# Patient Record
Sex: Female | Born: 1935 | Race: Black or African American | Hispanic: No | Marital: Married
Health system: Southern US, Community
[De-identification: ages and names within clinical notes are randomized; demographics above are authoritative.]

---

## 2000-03-30 ENCOUNTER — Encounter: Payer: Self-pay | Admitting: Ophthalmology

## 2000-04-02 ENCOUNTER — Ambulatory Visit (HOSPITAL_COMMUNITY): Admission: RE | Admit: 2000-04-02 | Discharge: 2000-04-02 | Payer: Self-pay | Admitting: Ophthalmology

## 2000-07-09 ENCOUNTER — Ambulatory Visit (HOSPITAL_COMMUNITY): Admission: RE | Admit: 2000-07-09 | Discharge: 2000-07-09 | Payer: Self-pay | Admitting: Ophthalmology

## 2006-09-25 ENCOUNTER — Ambulatory Visit: Payer: Self-pay | Admitting: Internal Medicine

## 2007-01-19 ENCOUNTER — Ambulatory Visit: Payer: Self-pay | Admitting: Internal Medicine

## 2007-11-26 IMAGING — CR DG CHEST 2V
1 series · 2 of 2 positions shown · non-contrast
Comparison: none

REASON FOR EXAM: Shortness of breath
COMMENTS:

[Series 1: view not recorded · 0.17mm/px · 2 of 2 slices shown]
[im 1/2]
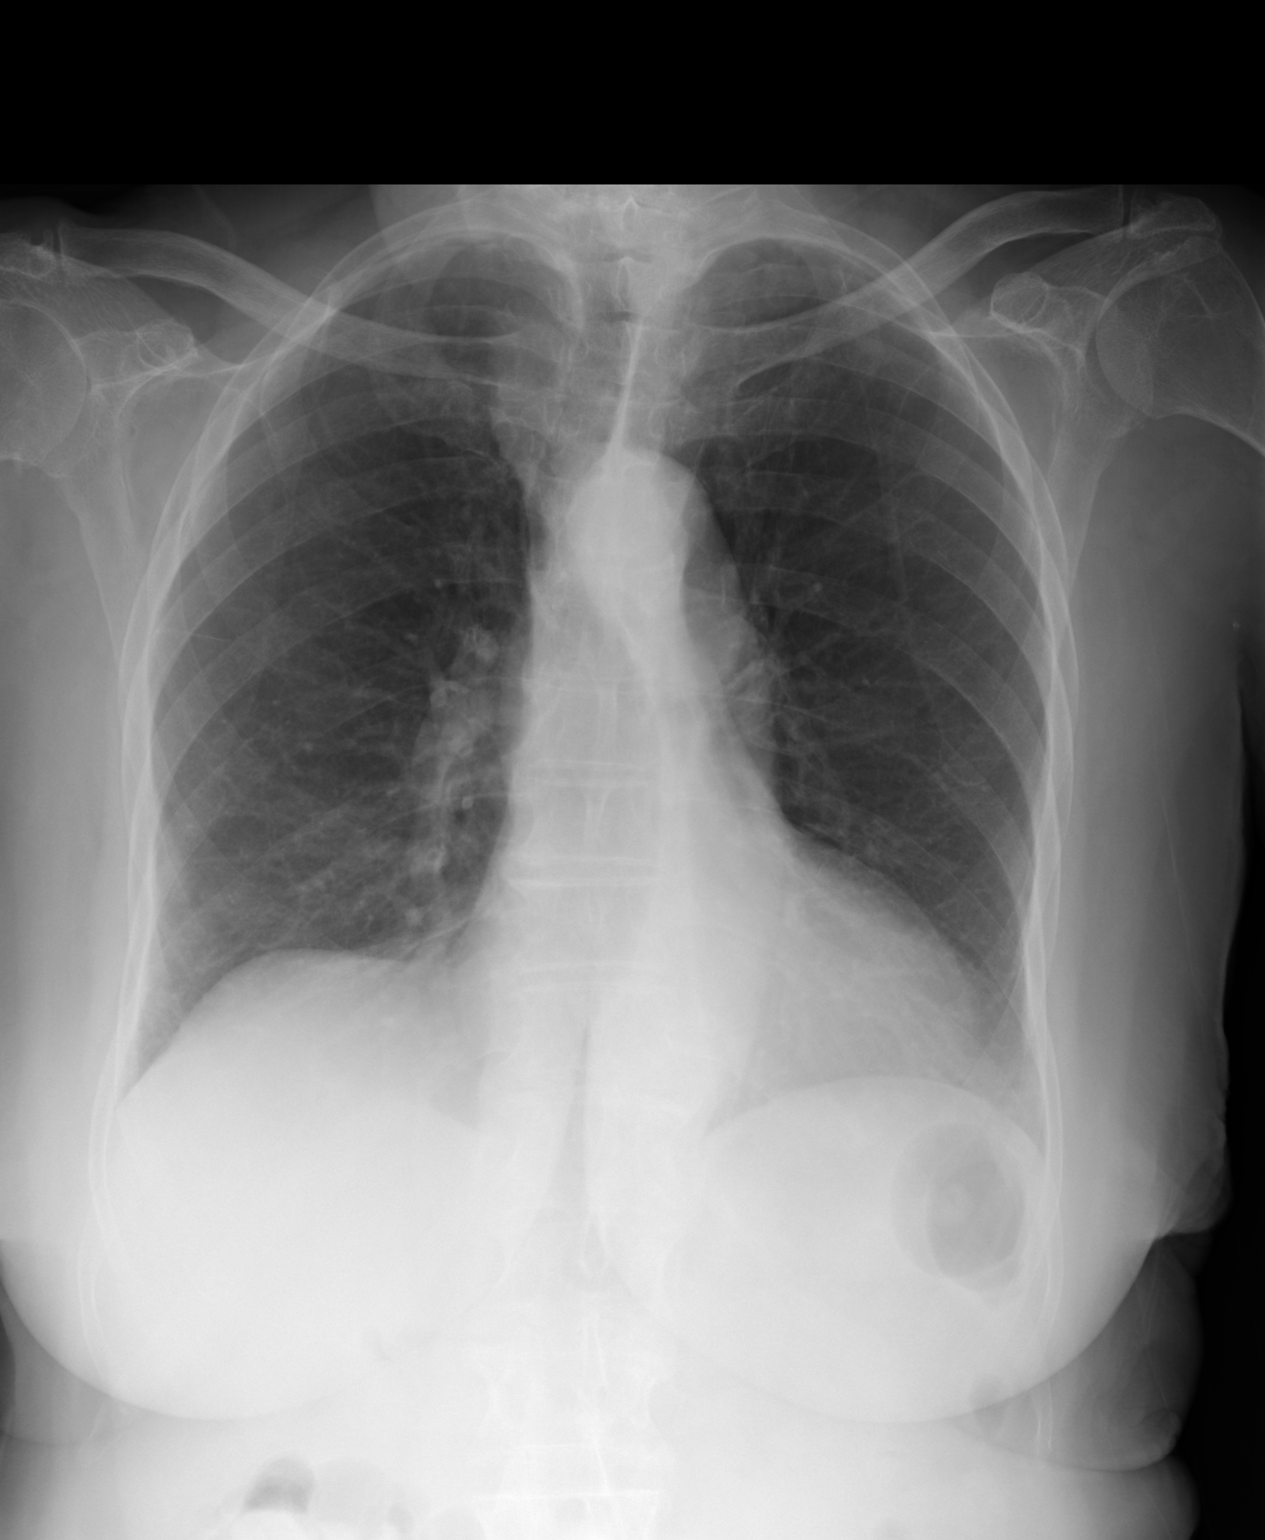
[im 2/2]
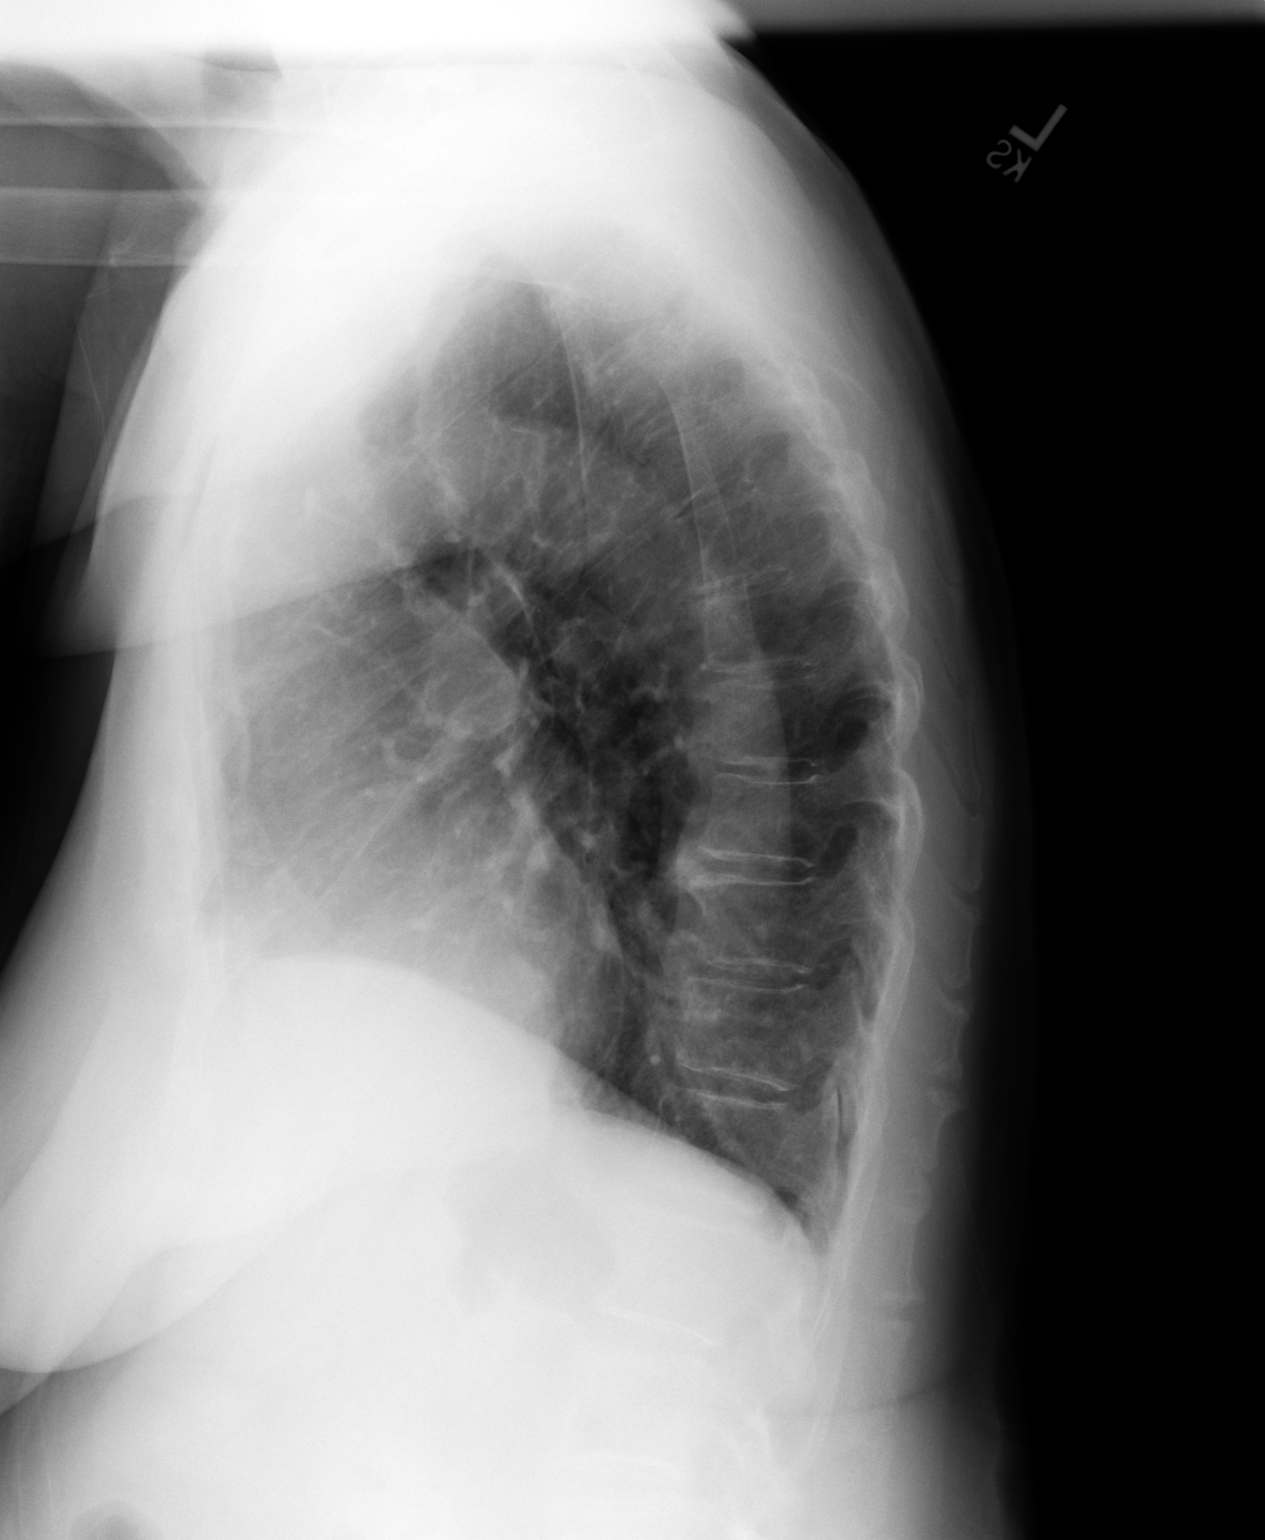

[2 of 2 positions shown; findings below may reference images not displayed]

PROCEDURE:     DXR - DXR CHEST PA (OR AP) AND LATERAL  - September 25, 2006  [DATE]

RESULT:     There is no evidence of focal infiltrates, effusions or edema.
The cardiac silhouette is moderately enlarged. The aorta is tortuous and
ectatic. The visualized bony skeleton demonstrates no evidence of fracture
or dislocation.
IMPRESSION: No evidence of acute cardiopulmonary disease.

## 2008-03-21 IMAGING — CR DG CHEST 2V
1 series · 2 of 2 positions shown · non-contrast
Comparison: none

REASON FOR EXAM: xray chest sob chf
COMMENTS:

[Series 1: view not recorded · 0.17mm/px · 2 of 2 slices shown]
[im 1/2]
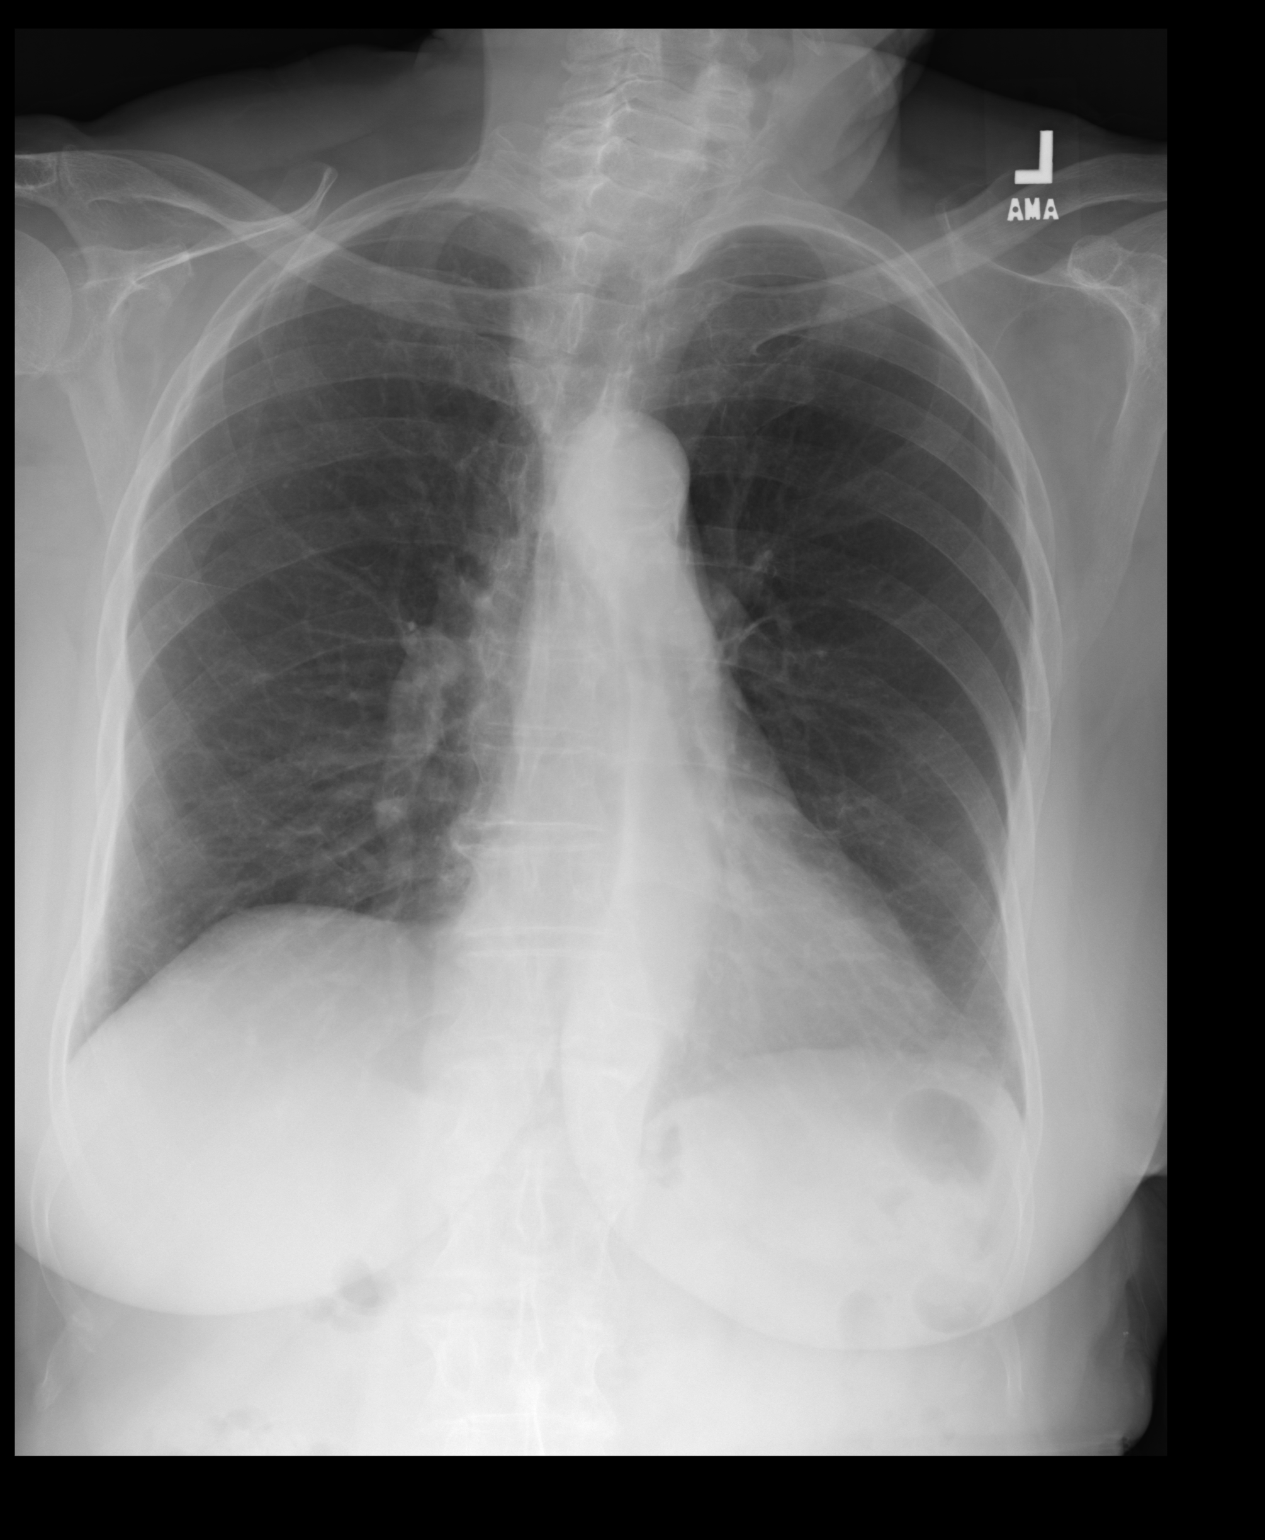
[im 2/2]
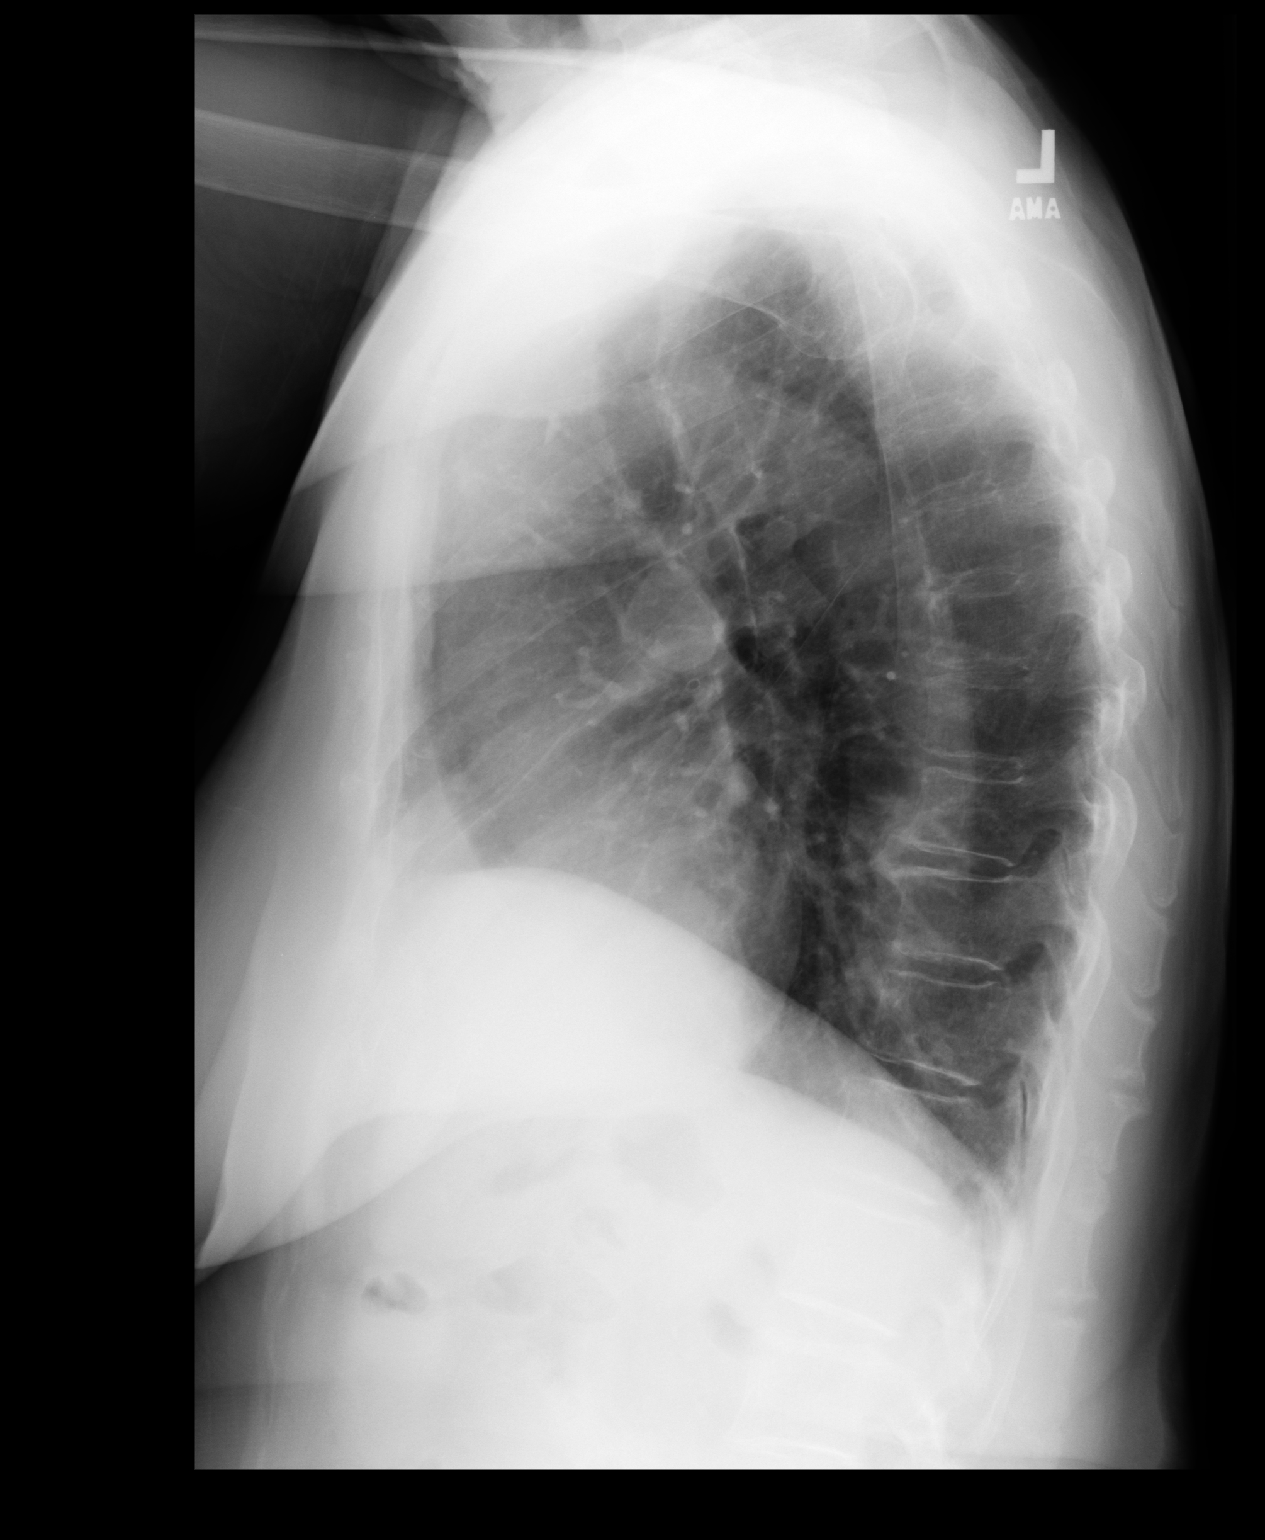

[2 of 2 positions shown; findings below may reference images not displayed]

PROCEDURE:     DXR - DXR CHEST PA (OR AP) AND LATERAL  - January 19, 2007 [DATE]

RESULT:     Comparison is made to the study of 09/25/2006. The lungs are
clear. The heart and pulmonary vessels are normal. The bony and mediastinal
structures are unremarkable. There is no effusion. There is no pneumothorax
or evidence of congestive failure.
IMPRESSION: No acute cardiopulmonary disease. There is no interval
change.

## 2011-02-21 ENCOUNTER — Ambulatory Visit (INDEPENDENT_AMBULATORY_CARE_PROVIDER_SITE_OTHER): Payer: Medicare Other | Admitting: Urology

## 2011-02-21 DIAGNOSIS — N952 Postmenopausal atrophic vaginitis: Secondary | ICD-10-CM

## 2011-02-21 DIAGNOSIS — N949 Unspecified condition associated with female genital organs and menstrual cycle: Secondary | ICD-10-CM

## 2011-09-26 ENCOUNTER — Ambulatory Visit (INDEPENDENT_AMBULATORY_CARE_PROVIDER_SITE_OTHER): Payer: Medicare Other | Admitting: Urology

## 2011-09-26 DIAGNOSIS — R82998 Other abnormal findings in urine: Secondary | ICD-10-CM

## 2011-09-26 DIAGNOSIS — N952 Postmenopausal atrophic vaginitis: Secondary | ICD-10-CM

## 2011-09-26 DIAGNOSIS — N760 Acute vaginitis: Secondary | ICD-10-CM

## 2011-09-26 DIAGNOSIS — N9489 Other specified conditions associated with female genital organs and menstrual cycle: Secondary | ICD-10-CM

## 2011-11-21 ENCOUNTER — Ambulatory Visit: Payer: Medicare Other | Admitting: Urology

## 2012-03-30 ENCOUNTER — Ambulatory Visit: Payer: Self-pay | Admitting: Emergency Medicine

## 2012-03-30 LAB — CBC WITH DIFFERENTIAL/PLATELET
Basophil %: 0.8 %
Comment - H1-Com2: NORMAL
Eosinophil #: 0.2 10*3/uL (ref 0.0–0.7)
Eosinophil %: 1.7 %
HCT: 34.4 % — ABNORMAL LOW (ref 40.0–52.0)
HGB: 11.4 g/dL — ABNORMAL LOW (ref 12.0–16.0)
Lymphocyte #: 2.3 10*3/uL (ref 1.0–3.6)
Lymphocyte %: 24.3 %
Lymphocytes: 23 %
MCH: 33.4 pg (ref 26.0–34.0)
MCHC: 33.2 g/dL (ref 32.0–36.0)
MCV: 101 fL — ABNORMAL HIGH (ref 80–100)
Monocyte #: 2.9 10*3/uL — ABNORMAL HIGH (ref 0.2–1.0)
Monocytes: 23 %
Neutrophil #: 4 10*3/uL (ref 1.4–6.5)
Neutrophil %: 42.2 %
Platelet: 390 10*3/uL (ref 150–440)
RDW: 15.2 % — ABNORMAL HIGH (ref 11.5–14.5)
WBC: 9.4 10*3/uL (ref 3.8–10.6)

## 2012-03-30 LAB — POTASSIUM: Potassium: 3.9 mmol/L (ref 3.5–5.1)

## 2012-04-06 ENCOUNTER — Ambulatory Visit: Payer: Self-pay | Admitting: Emergency Medicine

## 2012-04-12 LAB — PATHOLOGY REPORT

## 2013-10-23 DEATH — deceased

## 2013-11-16 ENCOUNTER — Telehealth: Payer: Self-pay

## 2013-11-16 NOTE — Telephone Encounter (Signed)
Patient past away per Obituary in [place] [facility] °

## 2015-01-09 NOTE — Op Note (Signed)
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   PROCEDURE: Excision tumor of the leg total measuring about 5 cm into 3 cm.  Excision of lesions of the right lower leg and closure was performed with a tissue closer and layered closure was performed the wound was too large to just stitch it completely.   SURGEON: Janeann Paisley S. Carie Kapuscinski, MD  DESCRIPTION OF PROCEDURE: Under MAC anesthesia, the right leg was then prepped and draped. On her shin there were two lesions. 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 First of all with a pen I marked boundry  tumor and since this was quite large pieces, piece of the skin had to be removed so it was trouble to close it together so first of all with a 15 blade I made incision around this lesion and it was then completely excised. Total skin and subcutaneous tissue was removed and after that undermining of the edges were performed on each side and quite a bit of undermining was done so then with 3-0 Vicryl sutures I brought both edges together after I mobilized the skin from each side and then closed in the middle. After that another 3 or 4 stitches of 3-0 Vicryl was then put in and then 3-0 nylon stitches were applied on the skin to close the wound. It looked very nice. Patient tolerated well, sent to recovery room in satisfactory condition.   ____________________________ Alton RevereMasud S. Cecelia ByarsHashmi, MD [email or web address] D: 04/06/2012 12:35:11 ET T: 04/06/2012 13:09:44 ET JOB#: [id number]  cc: Kinisha Soper S. Cecelia ByarsHashmi, MD, <Dictator> Meryle ReadyMASUD S Shaylan Tutton MD ELECTRONICALLY SIGNED 04/13/2012 9:35
